# Patient Record
Sex: Male | Born: 1980 | Race: Black or African American | Hispanic: No | Marital: Single | Smoking: Current every day smoker
Health system: Southern US, Community
[De-identification: ages and names within clinical notes are randomized; demographics above are authoritative.]

---

## 1999-06-13 ENCOUNTER — Encounter: Admission: RE | Admit: 1999-06-13 | Discharge: 1999-06-13 | Payer: Self-pay | Admitting: Obstetrics & Gynecology

## 2002-01-15 ENCOUNTER — Emergency Department (HOSPITAL_COMMUNITY): Admission: EM | Admit: 2002-01-15 | Discharge: 2002-01-15 | Payer: Self-pay | Admitting: *Deleted

## 2004-04-21 ENCOUNTER — Emergency Department (HOSPITAL_COMMUNITY): Admission: EM | Admit: 2004-04-21 | Discharge: 2004-04-21 | Payer: Self-pay | Admitting: Emergency Medicine

## 2007-01-09 ENCOUNTER — Emergency Department (HOSPITAL_COMMUNITY): Admission: EM | Admit: 2007-01-09 | Discharge: 2007-01-09 | Payer: Self-pay | Admitting: Emergency Medicine

## 2017-08-16 ENCOUNTER — Ambulatory Visit
Admission: EM | Admit: 2017-08-16 | Discharge: 2017-08-16 | Disposition: A | Discharge status: Home / Self Care | Payer: 59 | Attending: Family Medicine | Admitting: Family Medicine

## 2017-08-16 ENCOUNTER — Encounter: Payer: Self-pay | Admitting: Gynecology

## 2017-08-16 ENCOUNTER — Ambulatory Visit (INDEPENDENT_AMBULATORY_CARE_PROVIDER_SITE_OTHER): Payer: 59

## 2017-08-16 DIAGNOSIS — W231XXA Caught, crushed, jammed, or pinched between stationary objects, initial encounter: Secondary | ICD-10-CM | POA: Diagnosis not present

## 2017-08-16 DIAGNOSIS — S6710XA Crushing injury of unspecified finger(s), initial encounter: Secondary | ICD-10-CM

## 2017-08-16 DIAGNOSIS — S6702XA Crushing injury of left thumb, initial encounter: Secondary | ICD-10-CM | POA: Diagnosis not present

## 2017-08-16 DIAGNOSIS — S60112A Contusion of left thumb with damage to nail, initial encounter: Secondary | ICD-10-CM

## 2017-08-16 NOTE — ED Triage Notes (Signed)
Per patient smashed his left thumb in his car door x yesterday. Pt. C/o left thumb pain

## 2017-08-16 NOTE — ED Provider Notes (Signed)
[facility]    CSN: [id number] Arrival date & time: 08/16/17  1525     History   Chief Complaint Chief Complaint  Patient presents with  . Finger Injury    HPI Ryan Burns is a 36 y.o. male.   HPI  This is a 36 year old male who presents with a thumb injury after he closed it in a car door yesterday. His hand was actually trapped and had open the door to free the thumb from the car jam. That time he's had terrible throbbing pain with a subungual hematoma present at the base of his left nail.      History reviewed. No pertinent past medical history.  There are no active problems to display for this patient.   History reviewed. No pertinent surgical history.     Home Medications    Prior to Admission medications   Not on File    Family History Family History  Problem Relation Age of Onset  . Hypertension Mother   . Lupus Mother   . Cancer Father   . Glaucoma Father     Social History Social History  Substance Use Topics  . Smoking status: Current Every Day Smoker    Packs/day: 1.00  . Smokeless tobacco: Never Used  . Alcohol use No     Allergies   Patient has no known allergies.   Review of Systems Review of Systems  Constitutional: Positive for activity change. Negative for appetite change, chills, fatigue and fever.  Skin: Positive for color change.  All other systems reviewed and are negative.    Physical Exam Triage Vital Signs ED Triage Vitals  Enc Vitals Group     BP 08/16/17 1550 114/70     Pulse Rate 08/16/17 1550 72     Resp 08/16/17 1550 16     Temp 08/16/17 1550 98.3 F (36.8 C)     Temp Source 08/16/17 1550 Oral     SpO2 08/16/17 1550 98 %     Weight 08/16/17 1547 155 lb (70.3 kg)     Height 08/16/17 1547 6' (1.829 m)     Head Circumference --      Peak Flow --      Pain Score 08/16/17 1548 9     Pain Loc --      Pain Edu? --      Excl. in GC? --    No data found.   Updated Vital Signs BP 114/70  (BP Location: Left Arm)   Pulse 72   Temp 98.3 F (36.8 C) (Oral)   Resp 16   Ht 6' (1.829 m)   Wt 155 lb (70.3 kg)   SpO2 98%   BMI 21.02 kg/m   Visual Acuity Right Eye Distance:   Left Eye Distance:   Bilateral Distance:    Right Eye Near:   Left Eye Near:    Bilateral Near:     Physical Exam  Constitutional: He is oriented to person, place, and time. He appears well-developed and well-nourished. No distress.  HENT:  Head: Normocephalic.  Eyes: Pupils are equal, round, and reactive to light.  Neck: Normal range of motion.  Musculoskeletal: Normal range of motion. He exhibits tenderness.  Neurological: He is alert and oriented to person, place, and time.  Skin: Skin is warm and dry. He is not diaphoretic.  Psychiatric: He has a normal mood and affect. His behavior is normal. Judgment and thought content normal.  Nursing note and vitals reviewed.  UC Treatments / Results  Labs (all labs ordered are listed, but only abnormal results are displayed) Labs Reviewed - No data to display  EKG  EKG Interpretation None       Radiology Dg Finger Thumb Left  Result Date: 08/16/2017 CLINICAL DATA:  Trauma to the left thumb with pain. EXAM: LEFT THUMB 2+V COMPARISON:  None. FINDINGS: There is a radiolucency at the base of the first proximal phalanx. If the patient has focal pain here, nondisplaced fracture is not excluded. There is no dislocation. IMPRESSION: There is a radiolucency at the base of the first proximal phalanx. If the patient has focal pain here, nondisplaced fracture is not excluded. Electronically Signed   By: Sherian ReinWei-Chen  Lin M.D.   On: 08/16/2017 16:15    Procedures Procedures (including critical care time) After explaining trephination of the subungual hematoma to the patient the nail was cleansed widely with chlorhexidine prep. Hot cauterizing lance was then utilized to create a small hole in the proximal nail. Immediate discharge of the liquefied hematoma  spewed from the created hole. We gently expressed until all hematoma was evacuated. She tolerated the procedure well. A dry sterile dressing was then applied and a stack splint applied to the distal thumb for protection     Medications Ordered in UC Medications - No data to display   Initial Impression / Assessment and Plan / UC Course  I have reviewed the triage vital signs and the nursing notes.  Pertinent labs & imaging results that were available during my care of the patient were reviewed by me and considered in my medical decision making (see chart for details).     Elevate the hand to control pain and swelling. Signs and symptoms of infection were outlined to the patient. Will return to our clinic if any of these occur. I told him that he may lose his nail but  should grow back within 3-6 months. No antibiotics will be given at this time. He may follow-up with his primary care physician.  Final Clinical Impressions(s) / UC Diagnoses   Final diagnoses:  Crushing injury of finger, initial encounter  Subungual hematoma of left thumb, initial encounter    New Prescriptions There are no discharge medications for this patient.    Controlled Substance Prescriptions [place] Controlled Substance Registry consulted? Not Applicable   Lutricia FeilRoemer, William P, PA-C 08/16/17 1635

## 2019-08-21 DIAGNOSIS — R430 Anosmia: Secondary | ICD-10-CM | POA: Diagnosis not present

## 2019-08-21 DIAGNOSIS — Z1159 Encounter for screening for other viral diseases: Secondary | ICD-10-CM | POA: Diagnosis not present

## 2019-12-03 DIAGNOSIS — H16133 Photokeratitis, bilateral: Secondary | ICD-10-CM | POA: Diagnosis not present

## 2020-07-18 DIAGNOSIS — Z03818 Encounter for observation for suspected exposure to other biological agents ruled out: Secondary | ICD-10-CM | POA: Diagnosis not present

## 2020-07-18 DIAGNOSIS — Z20822 Contact with and (suspected) exposure to covid-19: Secondary | ICD-10-CM | POA: Diagnosis not present

## 2020-10-10 DIAGNOSIS — Z125 Encounter for screening for malignant neoplasm of prostate: Secondary | ICD-10-CM | POA: Diagnosis not present

## 2020-10-10 DIAGNOSIS — Z131 Encounter for screening for diabetes mellitus: Secondary | ICD-10-CM | POA: Diagnosis not present

## 2020-10-10 DIAGNOSIS — Z0000 Encounter for general adult medical examination without abnormal findings: Secondary | ICD-10-CM | POA: Diagnosis not present

## 2020-10-10 DIAGNOSIS — Z13220 Encounter for screening for lipoid disorders: Secondary | ICD-10-CM | POA: Diagnosis not present

## 2020-10-13 DIAGNOSIS — Z0000 Encounter for general adult medical examination without abnormal findings: Secondary | ICD-10-CM | POA: Diagnosis not present

## 2020-10-13 DIAGNOSIS — Z125 Encounter for screening for malignant neoplasm of prostate: Secondary | ICD-10-CM | POA: Diagnosis not present

## 2020-10-13 DIAGNOSIS — Z131 Encounter for screening for diabetes mellitus: Secondary | ICD-10-CM | POA: Diagnosis not present

## 2020-10-13 DIAGNOSIS — Z13220 Encounter for screening for lipoid disorders: Secondary | ICD-10-CM | POA: Diagnosis not present

## 2020-10-13 DIAGNOSIS — E559 Vitamin D deficiency, unspecified: Secondary | ICD-10-CM | POA: Diagnosis not present

## 2020-11-30 ENCOUNTER — Emergency Department: Admission: EM | Admit: 2020-11-30 | Discharge: 2020-11-30 | Discharge status: Against Medical Advice | Payer: 59

## 2020-12-01 ENCOUNTER — Telehealth (HOSPITAL_COMMUNITY): Payer: Self-pay | Admitting: Emergency Medicine

## 2020-12-01 ENCOUNTER — Telehealth (HOSPITAL_COMMUNITY): Payer: Self-pay

## 2020-12-01 ENCOUNTER — Ambulatory Visit (HOSPITAL_COMMUNITY)
Admission: EM | Admit: 2020-12-01 | Discharge: 2020-12-01 | Disposition: A | Discharge status: Home / Self Care | Payer: BC Managed Care – PPO | Attending: Emergency Medicine | Admitting: Emergency Medicine

## 2020-12-01 ENCOUNTER — Other Ambulatory Visit: Payer: Self-pay

## 2020-12-01 ENCOUNTER — Encounter (HOSPITAL_COMMUNITY): Payer: Self-pay | Admitting: Emergency Medicine

## 2020-12-01 ENCOUNTER — Ambulatory Visit (HOSPITAL_COMMUNITY)
Admit: 2020-12-01 | Discharge: 2020-12-01 | Disposition: A | Discharge status: Home / Self Care | Payer: BC Managed Care – PPO | Attending: Emergency Medicine | Admitting: Emergency Medicine

## 2020-12-01 DIAGNOSIS — N50811 Right testicular pain: Secondary | ICD-10-CM | POA: Diagnosis not present

## 2020-12-01 DIAGNOSIS — R369 Urethral discharge, unspecified: Secondary | ICD-10-CM

## 2020-12-01 DIAGNOSIS — N433 Hydrocele, unspecified: Secondary | ICD-10-CM

## 2020-12-01 DIAGNOSIS — N451 Epididymitis: Secondary | ICD-10-CM | POA: Diagnosis not present

## 2020-12-01 DIAGNOSIS — N5089 Other specified disorders of the male genital organs: Secondary | ICD-10-CM | POA: Diagnosis not present

## 2020-12-01 LAB — POCT URINALYSIS DIPSTICK, ED / UC
Bilirubin Urine: NEGATIVE
Glucose, UA: NEGATIVE mg/dL
Hgb urine dipstick: NEGATIVE
Ketones, ur: NEGATIVE mg/dL
Leukocytes,Ua: NEGATIVE
Nitrite: NEGATIVE
Protein, ur: NEGATIVE mg/dL
Specific Gravity, Urine: 1.025 (ref 1.005–1.030)
Urobilinogen, UA: 0.2 mg/dL (ref 0.0–1.0)
pH: 5.5 (ref 5.0–8.0)

## 2020-12-01 MED ORDER — HYDROCODONE-ACETAMINOPHEN 5-325 MG PO TABS
2.0000 | ORAL_TABLET | ORAL | 0 refills | Status: AC | PRN
Start: 2020-12-01 — End: ?

## 2020-12-01 MED ORDER — DOXYCYCLINE HYCLATE 100 MG PO CAPS: 100.0000 mg | ORAL_CAPSULE | Freq: Two times a day (BID) | ORAL | 0 refills | Status: DC

## 2020-12-01 NOTE — Telephone Encounter (Signed)
Pt was called and notified doxycycline for treatment of epididymitis and Norco for pain, sent to the pharmacy. Pt verbalized understanding.

## 2020-12-01 NOTE — ED Notes (Signed)
[place] at [place] today 12/01/20, check in at 2:45 pm

## 2020-12-01 NOTE — ED Provider Notes (Signed)
[facility]    CSN: [id number] Arrival date & time: 12/01/20  9983      History   Chief Complaint Chief Complaint  Patient presents with  . Testicle Pain    Right testicle pain and swelling    HPI Logyn Kendrick is a 39 y.o. male.   HPI   39 year old male here for evaluation of right testicle pain and swelling.  Patient reports that he woke up yesterday morning with the symptoms.  Patient reports that he has had some associated right flank pain, but it is more triggered by movement, and some urinary frequency.  Patient denies dysuria, urgency, blood in his urine, trauma to his groin, heavy lifting, redness, penile discharge, or fever.  Patient is currently on light duty due to finger injury.  History reviewed. No pertinent past medical history.  There are no problems to display for this patient.   History reviewed. No pertinent surgical history.     Home Medications    Prior to Admission medications   Not on File    Family History Family History  Problem Relation Age of Onset  . Hypertension Mother   . Lupus Mother   . Cancer Father   . Glaucoma Father     Social History Social History   Tobacco Use  . Smoking status: Current Every Day Smoker    Packs/day: 1.00  . Smokeless tobacco: Never Used  Substance Use Topics  . Alcohol use: No  . Drug use: No     Allergies   Patient has no known allergies.   Review of Systems Review of Systems  Constitutional: Negative for fever.  Gastrointestinal: Positive for abdominal pain. Negative for nausea and vomiting.       Right lower quadrant pain  Genitourinary: Positive for frequency, scrotal swelling and testicular pain. Negative for dysuria, genital sores, hematuria, penile discharge, penile pain, penile swelling and urgency.  Skin: Negative for color change and rash.  Hematological: Negative.   Psychiatric/Behavioral: Negative.      Physical Exam Triage Vital Signs ED Triage Vitals   Enc Vitals Group     BP 12/01/20 1041 117/79     Pulse Rate 12/01/20 1041 82     Resp 12/01/20 1041 16     Temp 12/01/20 1041 98.3 F (36.8 C)     Temp Source 12/01/20 1041 Oral     SpO2 12/01/20 1041 98 %     Weight --      Height --      Head Circumference --      Peak Flow --      Pain Score 12/01/20 1039 6     Pain Loc --      Pain Edu? --      Excl. in GC? --    No data found.  Updated Vital Signs BP 117/79   Pulse 82   Temp 98.3 F (36.8 C) (Oral)   Resp 16   SpO2 98%   Visual Acuity Right Eye Distance:   Left Eye Distance:   Bilateral Distance:    Right Eye Near:   Left Eye Near:    Bilateral Near:     Physical Exam Vitals and nursing note reviewed.  Constitutional:      General: He is not in acute distress.    Appearance: Normal appearance. He is not ill-appearing.  HENT:     Head: Normocephalic and atraumatic.  Cardiovascular:     Rate and Rhythm: Normal rate and regular rhythm.  Pulses: Normal pulses.     Heart sounds: Normal heart sounds. No murmur heard. No gallop.   Pulmonary:     Effort: Pulmonary effort is normal.     Breath sounds: Normal breath sounds. No wheezing, rhonchi or rales.  Abdominal:     General: Abdomen is flat.     Palpations: Abdomen is soft.     Tenderness: There is no abdominal tenderness. There is no right CVA tenderness or left CVA tenderness.  Genitourinary:    Comments: There is a yellow discharge from the urethral meatus.  Patient's left testicle is normal in size without tenderness to palpation.  The surface feels smooth and not rough.  There is no tenderness to the epididymis or the spermatic cord.  Right testicle is enlarged, tender to palpation, smooth in texture, there is inflammation of the epididymis, with tenderness to palpation.  No tenderness when palpating along the spermatic cord. Skin:    General: Skin is warm and dry.     Capillary Refill: Capillary refill takes less than 2 seconds.     Findings:  No erythema or rash.  Neurological:     General: No focal deficit present.     Mental Status: He is alert and oriented to person, place, and time.  Psychiatric:        Mood and Affect: Mood normal.        Behavior: Behavior normal.        Thought Content: Thought content normal.        Judgment: Judgment normal.      UC Treatments / Results  Labs (all labs ordered are listed, but only abnormal results are displayed) Labs Reviewed  POCT URINALYSIS DIPSTICK, ED / UC  CYTOLOGY, (ORAL, ANAL, URETHRAL) ANCILLARY ONLY    EKG   Radiology No results found.  Procedures Procedures (including critical care time)  Medications Ordered in UC Medications - No data to display  Initial Impression / Assessment and Plan / UC Course  I have reviewed the triage vital signs and the nursing notes.  Pertinent labs & imaging results that were available during my care of the patient were reviewed by me and considered in my medical decision making (see chart for details).   Patient is here for evaluation of right testicle pain and swelling that started yesterday morning when he woke up.  Patient denies any trauma or heavy lifting.  He is actively on light duty at work due to finger injury.  Patient does have a yellow discharge from his penis that he did not notice before physical exam.  Patient is sexually active with his fiance and his girlfriend.  He is having unprotected sex with both.  He denies any symptoms for them.  Will check UA, gonorrhea and chlamydia.  We will also obtain a ultrasound of his right testicle.  UA is negative for nitrites or leukocyte esterase.  Gonorrhea chlamydia is pending.  Results of scrotal ultrasound show right epididymitis, bilateral hydroceles, and 2 right scrotal pearls.  Will treat epididymitis with doxycycline twice daily x7 days, give Norco for pain, and referred to urology for follow-up.   Final Clinical Impressions(s) / UC Diagnoses   Final diagnoses:   Pain in right testicle  Penile discharge     Discharge Instructions     Your ultrasound is scheduled for this afternoon.  You need to check in at 230 at [facility].  Your urine did not show any signs of infection.  We will send urine  for culture analysis and if any infection is present we will treat you at that time.    ED Prescriptions    None     PDMP not reviewed this encounter.   Becky Augusta, NP 12/01/20 1913

## 2020-12-01 NOTE — Discharge Instructions (Addendum)
Your ultrasound is scheduled for this afternoon.  You need to check in at 230 at [facility].  Your urine did not show any signs of infection.  We will send urine for culture analysis and if any infection is present we will treat you at that time.

## 2020-12-01 NOTE — ED Triage Notes (Signed)
Woke up with right testicle pain and swelling. Slight pain in RLQ and right lower back as well. No dysuria. No penile drainage.

## 2020-12-02 NOTE — Telephone Encounter (Signed)
Your ultrasound reveals that you have epididymitis and also a hydrocele.  Hydrocele is a collection of fluid in the scrotum as we discussed earlier.  I am going to treat your epididymitis with doxycycline 100 mg twice daily for 10 days.  Continue to wear scrotal support as we discussed.  I will also prescribe Norco for pain control.  You can also use over-the-counter ibuprofen 6 mg every 6 hours with food.  You will need to follow-up with urology.

## 2020-12-04 LAB — CYTOLOGY, (ORAL, ANAL, URETHRAL) ANCILLARY ONLY
Chlamydia: POSITIVE — AB
Comment: NEGATIVE
Comment: NORMAL
Neisseria Gonorrhea: NEGATIVE

## 2020-12-12 ENCOUNTER — Ambulatory Visit (INDEPENDENT_AMBULATORY_CARE_PROVIDER_SITE_OTHER): Payer: BC Managed Care – PPO | Admitting: Urology

## 2020-12-12 ENCOUNTER — Other Ambulatory Visit: Payer: Self-pay

## 2020-12-12 ENCOUNTER — Encounter: Payer: Self-pay | Admitting: Urology

## 2020-12-12 ENCOUNTER — Other Ambulatory Visit
Admission: RE | Admit: 2020-12-12 | Discharge: 2020-12-12 | Disposition: A | Discharge status: Home / Self Care | Payer: BC Managed Care – PPO | Source: Ambulatory Visit | Attending: Urology | Admitting: Urology

## 2020-12-12 VITALS — BP 121/62 | HR 68 | Ht 73.0 in | Wt 188.4 lb

## 2020-12-12 DIAGNOSIS — R369 Urethral discharge, unspecified: Secondary | ICD-10-CM | POA: Insufficient documentation

## 2020-12-12 DIAGNOSIS — N453 Epididymo-orchitis: Secondary | ICD-10-CM | POA: Diagnosis not present

## 2020-12-12 DIAGNOSIS — N432 Other hydrocele: Secondary | ICD-10-CM

## 2020-12-12 LAB — URINALYSIS, COMPLETE (UACMP) WITH MICROSCOPIC
Bilirubin Urine: NEGATIVE
Glucose, UA: NEGATIVE mg/dL
Hgb urine dipstick: NEGATIVE
Ketones, ur: NEGATIVE mg/dL
Leukocytes,Ua: NEGATIVE
Nitrite: NEGATIVE
Protein, ur: NEGATIVE mg/dL
RBC / HPF: NONE SEEN RBC/hpf (ref 0–5)
Specific Gravity, Urine: >1.03 — ABNORMAL HIGH (ref 1.005–1.030)
pH: 5.5 (ref 5.0–8.0)

## 2020-12-12 NOTE — Progress Notes (Signed)
   12/12/20 9:42 AM   [name] 1981/03/14 [id number]  CC: Chlamydia, epididymitis, hydrocele  HPI: I saw [name] from in urology clinic today for recent diagnosis of right epididymitis and a right hydrocele.  He is a healthy 39 year old male who was having right scrotal swelling and pain and presented to the ED on 12/01/2020.  Work-up at that time was notable for positive chlamydia, and scrotal ultrasound consistent with right-sided epididymitis and likely reactive right hydrocele.  There were no testicular masses.  He reports no prior episodes of epididymitis.  He had trichomoniasis and 2016, but otherwise denies STDs.  He is sexually active with 2 different partners.  He reports his scrotal pain has completely resolved after a 7-day course of doxycycline.  He has some persistent mild scrotal swelling.  He denies any scrotal swelling prior to his recent diagnosis of epididymitis.  He denies any urinary symptoms.  Family History: Family History  Problem Relation Age of Onset  . Hypertension Mother   . Lupus Mother   . Cancer Father   . Glaucoma Father     Social History:  reports that he has been smoking. He has been smoking about 1.00 pack per day. He has never used smokeless tobacco. He reports that he does not drink alcohol and does not use drugs.  Physical Exam: BP 121/62 (BP Location: Left Arm, Patient Position: Sitting, Cuff Size: Large)   Pulse 68   Ht 6\' 1"  (1.854 m)   Wt 188 lb 6.4 oz (85.5 kg)   BMI 24.86 kg/m    Constitutional:  Alert and oriented, No acute distress. Cardiovascular: No clubbing, cyanosis, or edema. Respiratory: Normal respiratory effort, no increased work of breathing. GI: Abdomen is soft, nontender, nondistended, no abdominal masses GU: Phallus with patent meatus, no lesions, testicles 20 cc and descended bilaterally, mild to moderate persistent right scrotal swelling and hydrocele  Laboratory Data: Reviewed, see HPI  Pertinent  Imaging: I have personally viewed and interpreted the scrotal ultrasound dated 12/01/2020 showing right-sided epididymitis and moderate size right hydrocele.  Assessment & Plan:   In summary is a 39 year old male with recent diagnosis of chlamydia epididymitis, and scrotal ultrasound showing no testicular masses, but likely moderate size reactive right hydrocele.  We discussed this likely was in response to his epididymitis, and will resolve with time.  We discussed the natural progression of epididymitis.  We also discussed safe sex practices at length for prevention of recurrent STDs.  He would like to follow-up as needed which is very reasonable.  Would only consider intervention for his hydrocele if it continues to persist after resolution of his epididymitis, and he has bulky and bothersome pain or swelling on the right side.  Follow-up as needed  41, MD 12/12/2020  [facility] [address] [place], [place] [phone]

## 2020-12-12 NOTE — Patient Instructions (Signed)
Epididymitis  Epididymitis is swelling (inflammation) or infection of the epididymis. The epididymis is a cord-like structure that is located along the top and back part of the testicle. It collects and stores sperm from the testicle. This condition can also cause pain and swelling of the testicle and scrotum. Symptoms usually start suddenly (acute epididymitis). Sometimes epididymitis starts gradually and lasts for a while (chronic epididymitis). This type may be harder to treat. What are the causes? In men ages 20-40, this condition is usually caused by a bacterial infection or a sexually transmitted disease (STD), such as:  Gonorrhea.  Chlamydia. In men 40 and older who do not have anal sex, this condition is usually caused by bacteria from a blockage or from abnormalities in the urinary system. These can result from:  Having a tube placed into the bladder (urinary catheter).  Having an enlarged or inflamed prostate gland.  Having recently had urinary tract surgery.  Having a problem with a backward flow of urine (retrograde). In men who have a condition that weakens the body's defense system (immune system), such as HIV, this condition can be caused by:  Other bacteria, including tuberculosis and syphilis.  Viruses.  Fungi. Sometimes this condition occurs without infection. This may happen because of trauma or repetitive activities such as sports. What increases the risk? You are more likely to develop this condition if you have:  Unprotected sex with more than one partner.  Anal sex.  Recently had surgery.  A urinary catheter.  Urinary problems.  A suppressed immune system. What are the signs or symptoms? This condition usually begins suddenly with chills, fever, and pain behind the scrotum and in the testicle. Other symptoms include:  Swelling of the scrotum, testicle, or both.  Pain when ejaculating or urinating.  Pain in the back or  abdomen.  Nausea.  Itching and discharge from the penis.  A frequent need to pass urine.  Redness, increased warmth, and tenderness of the scrotum. How is this diagnosed? Your health care provider can diagnose this condition based on your symptoms and medical history. Your health care provider will also do a physical exam to ask about your symptoms and check your scrotum and testicle for swelling, pain, and redness. You may also have other tests, including:  Examination of discharge from the penis.  Urine tests for infections, such as STDs.  Ultrasound test for blood flow and inflammation. Your health care provider may test you for other STDs, including HIV. How is this treated? Treatment for this condition depends on the cause. If your condition is caused by a bacterial infection, oral antibiotic medicine may be prescribed. If the bacterial infection has spread to your blood, you may need to receive IV antibiotics. For both bacterial and nonbacterial epididymitis, you may be treated with:  Rest.  Elevation of the scrotum.  Pain medicines.  Anti-inflammatory medicines. Surgery may be needed to treat:  Bacterial epididymitis that causes pus to build up in the scrotum (abscess).  Chronic epididymitis that has not responded to other treatments. Follow these instructions at home: Medicines  Take over-the-counter and prescription medicines only as told by your health care provider.  If you were prescribed an antibiotic medicine, take it as told by your health care provider. Do not stop taking the antibiotic even if your condition improves. Sexual activity  If your epididymitis was caused by an STD, avoid sexual activity until your treatment is complete.  Inform your sexual partner or partners if you test positive for   an STD. They may need to be treated. Do not engage in sexual activity with your partner or partners until their treatment is completed. Managing pain and  swelling   If directed, elevate your scrotum and apply ice. ? Put ice in a plastic bag. ? Place a small towel or pillow between your legs. ? Rest your scrotum on the pillow or towel. ? Place another towel between your skin and the plastic bag. ? Leave the ice on for 20 minutes, 2-3 times a day.  Try taking a sitz bath to help with discomfort. This is a warm water bath that is taken while you are sitting down. The water should only come up to your hips and should cover your buttocks. Do this 3-4 times per day or as told by your health care provider.  Keep your scrotum elevated and supported while resting. Ask your health care provider if you should wear a scrotal support, such as a jockstrap. Wear it as told by your health care provider. General instructions  Return to your normal activities as told by your health care provider. Ask your health care provider what activities are safe for you.  Drink enough fluid to keep your urine pale yellow.  Keep all follow-up visits as told by your health care provider. This is important. Contact a health care provider if:  You have a fever.  Your pain medicine is not helping.  Your pain is getting worse.  Your symptoms do not improve within 3 days. Summary  Epididymitis is swelling (inflammation) or infection of the epididymis. This condition can also cause pain and swelling of the testicle and scrotum.  Treatment for this condition depends on the cause. If your condition is caused by a bacterial infection, oral antibiotic medicine may be prescribed.  Inform your sexual partner or partners if you test positive for an STD. They may need to be treated. Do not engage in sexual activity with your partner or partners until their treatment is completed.  Contact a health care provider if your symptoms do not improve within 3 days. This information is not intended to replace advice given to you by your health care provider. Make sure you discuss any  questions you have with your health care provider. Document Revised: 10/05/2018 Document Reviewed: 10/06/2018 Elsevier Patient Education  2020 [employer]   Chlamydia, Male  Chlamydia is an STD (sexually transmitted disease). It is a bacterial infection that spreads through sexual contact (is contagious). Chlamydia can occur in different areas of the body, including the tube that moves urine from the bladder out of the body (urethra), the throat, or the rectum. This condition is not difficult to treat. However, if left untreated, chlamydia can lead to more serious health problems. What are the causes? Chlamydia is caused by the bacteria Chlamydia trachomatis. It is passed from an infected partner during sexual activity. Chlamydia can spread through contact with the genitals, mouth, or rectum. What are the signs or symptoms? In some cases, there may not be any symptoms for this condition (asymptomatic), especially early in the infection. If symptoms develop, they may include:  Burning when urinating.  Urinating frequently.  Pain or swelling in the testicles.  Watery, mucus-like discharge from the penis.  Redness, soreness, and swelling (inflammation) of the rectum.  Bleeding or discharge from the rectum.  Abdominal pain.  Itching, burning, or redness in the eyes, or discharge from the eyes. How is this diagnosed? This condition may be diagnosed based on:  Urine  tests.  Swab tests. Depending on your symptoms, your health care provider may use a cotton swab to collect discharge from your urethra or rectum to test for the bacteria. How is this treated? This condition is treated with antibiotic medicines. Follow these instructions at home: Medicines  Take over-the-counter and prescription medicines only as told by your health care provider.  Take your antibiotic medicine as told by your health care provider. Do not stop taking the antibiotic even if you start to feel  better. Sexual activity  Tell sexual partners about your infection. This includes any oral, anal, or vaginal sex partners you have had within 60 days of when your symptoms started. Sexual partners should also be treated, even if they have no signs of the disease.  Do not have sex until you and your sexual partners have completed treatment and your health care provider says it is okay. If your health care provider prescribed you a single dose treatment, wait 7 days after taking the treatment before having sex. General instructions  It is your responsibility to get your test results. Ask your health care provider, or the department performing the test, when your results will be ready.  Get plenty of rest.  Eat a healthy, well-balanced diet.  Drink enough fluids to keep your urine clear or pale yellow.  Keep all follow-up visits as told by your health care provider. This is important. You may need to be tested for infection again 3 months after treatment. How is this prevented? The only sure way to prevent chlamydia is to avoid sexual intercourse. However, you can lower your risk by:  Using latex condoms correctly every time you have sexual intercourse.  Not having multiple sexual partners.  Asking if your sexual partner has been tested for STIs and had negative results. Contact a health care provider if:  You develop new symptoms or your symptoms do not get better after completing treatment.  You have a fever or chills.  You have pain during sexual intercourse.  You develop new joint pain or swelling near your joints.  You have pain or soreness in your testicles. Get help right away if:  Your pain gets worse and does not get better with medicine.  You have abnormal discharge.  You develop flu-like symptoms, such as night sweats, sore throat, or muscle aches. Summary  Chlamydia is an STD (sexually transmitted disease). It is a bacterial infection that spreads (is contagious)  through sexual contact.  This condition is not difficult to treat, however, if left untreated, it can lead to more serious health problems.  In some cases, there may not be any symptoms for this condition (asymptomatic).  This condition is treated with antibiotic medicines.  Using latex condoms correctly every time you have sexual intercourse can help prevent chlamydia. This information is not intended to replace advice given to you by your health care provider. Make sure you discuss any questions you have with your health care provider. Document Revised: 12/17/2017 Document Reviewed: 11/18/2016 Elsevier Patient Education  2020 Else[employer] Inc.   Hydrocele, Adult A hydrocele is a collection of fluid in the loose pouch of skin that holds the testicles (scrotum). This may happen because:  The amount of fluid produced in the scrotum is not absorbed by the rest of the body.  Fluid from the abdomen fills the scrotum. Normally, the testicles develop in the abdomen then move (drop) into to the scrotum before birth. The tube that the testicles travel through usually closes after  the testicles drop. If the tube does not close, fluid from the abdomen can fill the scrotum. This is less common in adults. What are the causes? The cause of a hydrocele in adults is usually not known. However, it may be caused by:  An injury to the scrotum.  An infection (epididymitis).  Decreased blood flow to the scrotum.  Twisting of a testicle (testicular torsion).  A birth defect.  A tumor or cancer of the testicle. What are the signs or symptoms? A hydrocele feels like a water-filled balloon. It may also feel heavy. Other symptoms include:  Swelling of the scrotum. The swelling may decrease when you lie down. You may also notice more swelling at night than in the morning.  Swelling of the groin.  Mild discomfort in the scrotum.  Pain. This can develop if the hydrocele was caused by infection or  twisting. The larger the hydrocele, the more likely you are to have pain. How is this diagnosed? This condition may be diagnosed based on:  Physical exam.  Medical history. You may also have other tests, including:  Imaging tests, such as ultrasound.  Blood or urine tests. How is this treated? Most hydroceles go away on their own. If you have no discomfort or pain, your health care provider may suggest close monitoring of your condition (called watch and wait or watchful waiting) until the condition goes away or symptoms develop. If treatment is needed, it may include:  Treating an underlying condition. This may include using an antibiotic medicine to treat an infection.  Surgery to stop fluid from collecting in the scrotum.  Surgery to drain the fluid. Options include: ? Needle aspiration. A needle is used to drain fluid. However, the fluid buildup will come back quickly. ? Hydrocelectomy. For this procedure, an incision is made in the scrotum to remove the fluid sac. Follow these instructions at home:  Watch the hydrocele for any changes.  Take over-the-counter and prescription medicines only as told by your health care provider.  If you were prescribed an antibiotic medicine, use it as told by your health care provider. Do not stop taking the antibiotic even if you start to feel better.  Keep all follow-up visits as told by your health care provider. This is important. Contact a health care provider if:  You notice any changes in the hydrocele.  The swelling in your scrotum or groin gets worse.  The hydrocele becomes red, firm, painful, or tender to the touch.  You have a fever. Get help right away if you:  Develop a lot of pain, or your pain becomes worse. Summary  A hydrocele is a collection of fluid in the loose pouch of skin that holds the testicles (scrotum).  Hydroceles can cause swelling, discomfort, and sometimes pain.  In adults, the cause of a hydrocele  usually is not known. However, it is sometimes caused by an infection or a rotation and twisting of the scrotum.  Treatment is usually not needed. Hydroceles often go away on their own. If a hydrocele causes pain, treatment may be given to ease the pain. This information is not intended to replace advice given to you by your health care provider. Make sure you discuss any questions you have with your health care provider. Document Revised: 12/13/2017 Document Reviewed: 12/13/2017 Elsevier Patient Education  2020 [employer].

## 2020-12-14 LAB — URINE CULTURE: Culture: NO GROWTH

## 2021-01-12 DIAGNOSIS — M79642 Pain in left hand: Secondary | ICD-10-CM | POA: Diagnosis not present

## 2021-05-23 DIAGNOSIS — N342 Other urethritis: Secondary | ICD-10-CM | POA: Diagnosis not present

## 2021-06-06 DIAGNOSIS — Z716 Tobacco abuse counseling: Secondary | ICD-10-CM | POA: Diagnosis not present

## 2021-06-06 DIAGNOSIS — F17210 Nicotine dependence, cigarettes, uncomplicated: Secondary | ICD-10-CM | POA: Diagnosis not present

## 2021-10-06 ENCOUNTER — Emergency Department: Payer: Self-pay

## 2021-10-06 ENCOUNTER — Other Ambulatory Visit: Payer: Self-pay

## 2021-10-06 ENCOUNTER — Emergency Department
Admission: EM | Admit: 2021-10-06 | Discharge: 2021-10-06 | Disposition: A | Discharge status: Home / Self Care | Payer: Self-pay | Attending: Emergency Medicine | Admitting: Emergency Medicine

## 2021-10-06 DIAGNOSIS — J40 Bronchitis, not specified as acute or chronic: Secondary | ICD-10-CM | POA: Insufficient documentation

## 2021-10-06 DIAGNOSIS — F17210 Nicotine dependence, cigarettes, uncomplicated: Secondary | ICD-10-CM | POA: Insufficient documentation

## 2021-10-06 DIAGNOSIS — Z20822 Contact with and (suspected) exposure to covid-19: Secondary | ICD-10-CM | POA: Insufficient documentation

## 2021-10-06 LAB — RESP PANEL BY RT-PCR (FLU A&B, COVID) ARPGX2
Influenza A by PCR: NEGATIVE
Influenza B by PCR: NEGATIVE
SARS Coronavirus 2 by RT PCR: NEGATIVE

## 2021-10-06 MED ORDER — ALBUTEROL SULFATE HFA 108 (90 BASE) MCG/ACT IN AERS: 2.0000 | INHALATION_SPRAY | Freq: Four times a day (QID) | RESPIRATORY_TRACT | 2 refills | Status: AC | PRN

## 2021-10-06 MED ORDER — PREDNISONE 10 MG (21) PO TBPK: ORAL_TABLET | ORAL | 0 refills | Status: AC

## 2021-10-06 MED ORDER — PREDNISONE 20 MG PO TABS
60.0000 mg | ORAL_TABLET | Freq: Once | ORAL | Status: AC
  Administered 2021-10-06: 60 mg via ORAL
  Filled 2021-10-06: qty 3

## 2021-10-06 NOTE — ED Triage Notes (Signed)
Pt presents to ER c/o cold symptoms for last few days that have mostly resolved but today pt noticed he was having some upper resp congestion and a cough.  Pt states he is coughing up light green/brown phlegm when he does cough.  Pt does not appear in distress in triage.

## 2021-10-06 NOTE — Discharge Instructions (Signed)
You can take 4 puffs of albuterol every 6 hours as needed for wheezing. Take tapered steroid as directed.

## 2021-10-06 NOTE — ED Provider Notes (Signed)
[facility]-EMERGENCY DEPARTMENT  ____________________________________________  Time seen: Approximately 11:20 PM  I have reviewed the triage vital signs and the nursing notes.   HISTORY  Chief Complaint URI   Historian Patient     HPI Ryan Burns is a 40 y.o. male presents to the emergency department with cough for the past 4 to 5 days and low-grade fever.  Patient reports that overall he is feeling better but noticed that he started wheezing last night and became concerned.  No current chest pain, chest tightness or shortness of breath.   History reviewed. No pertinent past medical history.   Immunizations up to date:  Yes.     History reviewed. No pertinent past medical history.  There are no problems to display for this patient.   History reviewed. No pertinent surgical history.  Prior to Admission medications   Medication Sig Start Date End Date Taking? Authorizing Provider  albuterol (VENTOLIN HFA) 108 (90 Base) MCG/ACT inhaler Inhale 2 puffs into the lungs every 6 (six) hours as needed for wheezing or shortness of breath. 10/06/21  Yes Pia Mau M, PA-C  predniSONE (STERAPRED UNI-PAK 21 TAB) 10 MG (21) TBPK tablet Take 6 tablets the first day, take 5 tablets the second day, take 4 tablets the third day, take 3 tablets the fourth day, take 2 tablets the fifth day, take 1 tablet the sixth day. 10/06/21  Yes Pia Mau M, PA-C  doxycycline (VIBRAMYCIN) 100 MG capsule Take 1 capsule (100 mg total) by mouth 2 (two) times daily. 12/01/20   Becky Augusta, NP  HYDROcodone-acetaminophen (NORCO/VICODIN) 5-325 MG tablet Take 2 tablets by mouth every 4 (four) hours as needed. 12/01/20   Becky Augusta, NP  Vitamin D, Ergocalciferol, (DRISDOL) 1.25 MG (50000 UNIT) CAPS capsule Take 50,000 Units by mouth once a week. 12/06/20   [provider]    Allergies Patient has no known allergies.  Family History  Problem Relation Age of Onset   Hypertension Mother     Lupus Mother    Cancer Father    Glaucoma Father     Social History Social History   Tobacco Use   Smoking status: Every Day    Packs/day: 1.00    Types: Cigarettes   Smokeless tobacco: Never  Substance Use Topics   Alcohol use: No   Drug use: No     Review of Systems  Constitutional: No fever/chills Eyes:  No discharge ENT: No upper respiratory complaints. Respiratory: Patient has cough and wheezing.  Gastrointestinal:   No nausea, no vomiting.  No diarrhea.  No constipation. Musculoskeletal: Negative for musculoskeletal pain. Skin: Negative for rash, abrasions, lacerations, ecchymosis.   ____________________________________________   PHYSICAL EXAM:  VITAL SIGNS: ED Triage Vitals  Enc Vitals Group     BP 10/06/21 2134 127/72     Pulse Rate 10/06/21 2134 67     Resp 10/06/21 2134 19     Temp 10/06/21 2134 98.5 F (36.9 C)     Temp Source 10/06/21 2134 Oral     SpO2 10/06/21 2134 97 %     Weight 10/06/21 2135 165 lb (74.8 kg)     Height 10/06/21 2135 6\' 1"  (1.854 m)     Head Circumference --      Peak Flow --      Pain Score 10/06/21 2135 4     Pain Loc --      Pain Edu? --      Excl. in GC? --      Constitutional:  Alert and oriented. Patient is lying supine. Eyes: Conjunctivae are normal. PERRL. EOMI. Head: Atraumatic. ENT:      Ears: Tympanic membranes are mildly injected with mild effusion bilaterally.       Nose: No congestion/rhinnorhea.      Mouth/Throat: Mucous membranes are moist. Posterior pharynx is mildly erythematous.  Hematological/Lymphatic/Immunilogical: No cervical lymphadenopathy.  Cardiovascular: Normal rate, regular rhythm. Normal S1 and S2.  Good peripheral circulation. Respiratory: Normal respiratory effort without tachypnea or retractions. Lungs CTAB. Good air entry to the bases with no decreased or absent breath sounds. Gastrointestinal: Bowel sounds 4 quadrants. Soft and nontender to palpation. No guarding or rigidity. No  palpable masses. No distention. No CVA tenderness. Musculoskeletal: Full range of motion to all extremities. No gross deformities appreciated. Neurologic:  Normal speech and language. No gross focal neurologic deficits are appreciated.  Skin:  Skin is warm, dry and intact. No rash noted. Psychiatric: Mood and affect are normal. Speech and behavior are normal. Patient exhibits appropriate insight and judgement.   ____________________________________________   LABS (all labs ordered are listed, but only abnormal results are displayed)  Labs Reviewed  RESP PANEL BY RT-PCR (FLU A&B, COVID) [id number]   ____________________________________________  EKG   ____________________________________________  RADIOLOGY Geraldo Pitter, personally viewed and evaluated these images (plain radiographs) as part of my medical decision making, as well as reviewing the written report by the radiologist.  DG Chest 2 View  Result Date: 10/06/2021 CLINICAL DATA:  Cough and congestion. EXAM: CHEST - 2 VIEW COMPARISON:  None. FINDINGS: The heart size and mediastinal contours are within normal limits. Both lungs are clear. The visualized skeletal structures are unremarkable. IMPRESSION: No active cardiopulmonary disease. Electronically Signed   By: Aram Candela M.D.   On: 10/06/2021 21:50    ____________________________________________    PROCEDURES  Procedure(s) performed:     Procedures     Medications  predniSONE (DELTASONE) tablet 60 mg (has no administration in time range)     ____________________________________________   INITIAL IMPRESSION / ASSESSMENT AND PLAN / ED COURSE  Pertinent labs & imaging results that were available during my care of the patient were reviewed by me and considered in my medical decision making (see chart for details).      Assessment and plan Bronchitis 40 year old male presents to the emergency department with cough and wheezing for the past 3 to  4 days.  No signs of pneumonia on chest x-ray.  Vital signs are reassuring at triage.  On physical exam, patient was alert, active and nontoxic-appearing.  We will treat with tapered prednisone and albuterol inhaler at discharge.  Return precautions were given to return with new or worsening symptoms.     ____________________________________________  FINAL CLINICAL IMPRESSION(S) / ED DIAGNOSES  Final diagnoses:  Bronchitis      NEW MEDICATIONS STARTED DURING THIS VISIT:  ED Discharge Orders          Ordered    predniSONE (STERAPRED UNI-PAK 21 TAB) 10 MG (21) TBPK tablet        10/06/21 2317    albuterol (VENTOLIN HFA) 108 (90 Base) MCG/ACT inhaler  Every 6 hours PRN        10/06/21 2317                This chart was dictated using voice recognition software/Dragon. Despite best efforts to proofread, errors can occur which can change the meaning. Any change was purely unintentional.     Orvil Feil, PA-C 10/06/21  2322    Sharman Cheek, MD 10/07/21 Perlie Mayo

## 2021-10-06 NOTE — ED Notes (Signed)
Dc ppw provided. Rx infor provided. Pt denies questions at this time. Verbal consent for DC> pt assisted off unit on foot.

## 2021-12-09 ENCOUNTER — Other Ambulatory Visit: Payer: Self-pay

## 2021-12-09 ENCOUNTER — Encounter: Payer: Self-pay | Admitting: Emergency Medicine

## 2021-12-09 DIAGNOSIS — F17210 Nicotine dependence, cigarettes, uncomplicated: Secondary | ICD-10-CM | POA: Insufficient documentation

## 2021-12-09 DIAGNOSIS — K029 Dental caries, unspecified: Secondary | ICD-10-CM | POA: Insufficient documentation

## 2021-12-09 NOTE — ED Triage Notes (Signed)
Pt to ED from home c/o upper back left tooth pain x3 days.  States has cavity there but does not have insurance yet to go to dentist to have it filled.  Denies fevers.

## 2021-12-10 ENCOUNTER — Emergency Department
Admission: EM | Admit: 2021-12-10 | Discharge: 2021-12-10 | Disposition: A | Discharge status: Home / Self Care | Payer: Self-pay | Attending: Emergency Medicine | Admitting: Emergency Medicine

## 2021-12-10 DIAGNOSIS — K029 Dental caries, unspecified: Secondary | ICD-10-CM

## 2021-12-10 MED ORDER — NAPROXEN 500 MG PO TABS: 500.0000 mg | ORAL_TABLET | Freq: Two times a day (BID) | ORAL | 0 refills | Status: AC

## 2021-12-10 MED ORDER — NAPROXEN 500 MG PO TABS
500.0000 mg | ORAL_TABLET | Freq: Once | ORAL | Status: AC
  Administered 2021-12-10: 01:00:00 500 mg via ORAL
  Filled 2021-12-10: qty 1

## 2021-12-10 MED ORDER — AMOXICILLIN 500 MG PO TABS: 500.0000 mg | ORAL_TABLET | Freq: Two times a day (BID) | ORAL | 0 refills | Status: AC

## 2021-12-10 MED ORDER — LIDOCAINE VISCOUS HCL 2 % MT SOLN: 15.0000 mL | OROMUCOSAL | 0 refills | Status: AC | PRN

## 2021-12-10 MED ORDER — LIDOCAINE VISCOUS HCL 2 % MT SOLN
15.0000 mL | Freq: Once | OROMUCOSAL | Status: AC
  Administered 2021-12-10: 01:00:00 15 mL via OROMUCOSAL
  Filled 2021-12-10: qty 15

## 2021-12-10 NOTE — ED Provider Notes (Signed)
[facility] Emergency Department Provider Note  ____________________________________________  Time seen: Approximately 1:54 AM  I have reviewed the triage vital signs and the nursing notes.   HISTORY  Chief Complaint Dental Pain   HPI Ryan Burns is a 40 y.o. male presents for evaluation of dental pain.  Patient reports having a cavity on his left upper molar for a while but started having pain 3 days ago.  Is complaining of throbbing sharp constant pain.  Has taken Tylenol and ibuprofen with no significant relief.  He does have a dentist but because of the Christmas holiday has not been able to get in with an appointment.  He denies trismus, drooling, fever.  History reviewed. No pertinent past medical history.  There are no problems to display for this patient.   History reviewed. No pertinent surgical history.  Prior to Admission medications   Medication Sig Start Date End Date Taking? Authorizing Provider  amoxicillin (AMOXIL) 500 MG tablet Take 1 tablet (500 mg total) by mouth 2 (two) times daily for 7 days. 12/10/21 12/17/21 Yes Jai Steil, [place], MD  lidocaine (XYLOCAINE) 2 % solution Use as directed 15 mLs in the mouth or throat as needed for mouth pain. 12/10/21  Yes Don Perking, [place], MD  naproxen (NAPROSYN) 500 MG tablet Take 1 tablet (500 mg total) by mouth 2 (two) times daily with a meal. 12/10/21 12/10/22 Yes Don Perking, [place], MD  albuterol (VENTOLIN HFA) 108 (90 Base) MCG/ACT inhaler Inhale 2 puffs into the lungs every 6 (six) hours as needed for wheezing or shortness of breath. 10/06/21   Orvil Feil, PA-C  HYDROcodone-acetaminophen (NORCO/VICODIN) 5-325 MG tablet Take 2 tablets by mouth every 4 (four) hours as needed. 12/01/20   Becky Augusta, NP  predniSONE (STERAPRED UNI-PAK 21 TAB) 10 MG (21) TBPK tablet Take 6 tablets the first day, take 5 tablets the second day, take 4 tablets the third day, take 3 tablets the fourth day, take 2  tablets the fifth day, take 1 tablet the sixth day. 10/06/21   Orvil Feil, PA-C  Vitamin D, Ergocalciferol, (DRISDOL) 1.25 MG (50000 UNIT) CAPS capsule Take 50,000 Units by mouth once a week. 12/06/20   [provider]    Allergies Patient has no known allergies.  Family History  Problem Relation Age of Onset   Hypertension Mother    Lupus Mother    Cancer Father    Glaucoma Father     Social History Social History   Tobacco Use   Smoking status: Every Day    Packs/day: 1.00    Types: Cigarettes   Smokeless tobacco: Never  Substance Use Topics   Alcohol use: Yes   Drug use: Yes    Types: Marijuana    Review of Systems  Constitutional: Negative for fever. Eyes: Negative for visual changes. ENT: Negative for sore throat. + dental pain Neck: No neck pain  Cardiovascular: Negative for chest pain. Respiratory: Negative for shortness of breath. Gastrointestinal: Negative for abdominal pain, vomiting or diarrhea. Genitourinary: Negative for dysuria. Musculoskeletal: Negative for back pain. Skin: Negative for rash. Neurological: Negative for headaches, weakness or numbness. Psych: No SI or HI  ____________________________________________   PHYSICAL EXAM:  VITAL SIGNS: ED Triage Vitals  Enc Vitals Group     BP 12/09/21 2322 120/68     Pulse Rate 12/09/21 2322 65     Resp 12/09/21 2322 18     Temp 12/09/21 2322 98 F (36.7 C)     Temp Source  12/09/21 2322 Oral     SpO2 12/09/21 2322 96 %     Weight 12/09/21 2321 155 lb (70.3 kg)     Height 12/09/21 2321 6' (1.829 m)     Head Circumference --      Peak Flow --      Pain Score 12/09/21 2321 7     Pain Loc --      Pain Edu? --      Excl. in GC? --     Constitutional: Alert and oriented. Well appearing and in no apparent distress. HEENT:      Head: Normocephalic and atraumatic.         Eyes: Conjunctivae are normal. Sclera is non-icteric.       Mouth/Throat: Mucous membranes are moist.  Left  upper molar has a deep cavity with no signs of abscess.  Floor of the mouth is soft with no induration or tenderness.  There is no trismus      Neck: Supple with no signs of meningismus. Cardiovascular: Regular rate and rhythm.  Respiratory: Normal respiratory effort.  Musculoskeletal:  No edema, cyanosis, or erythema of extremities. Neurologic: Normal speech and language. Face is symmetric. Moving all extremities. No gross focal neurologic deficits are appreciated. Skin: Skin is warm, dry and intact. No rash noted. Psychiatric: Mood and affect are normal. Speech and behavior are normal.  ____________________________________________   LABS (all labs ordered are listed, but only abnormal results are displayed)  Labs Reviewed - No data to display ____________________________________________  EKG  none  ____________________________________________  RADIOLOGY  none  ____________________________________________   PROCEDURES  Procedure(s) performed: None Procedures   Critical Care performed:  None ____________________________________________   INITIAL IMPRESSION / ASSESSMENT AND PLAN / ED COURSE  40 y.o. male presents for evaluation of dental pain.  Pain from cavity.  Patient was see his dentist this week.  Patient was given a dose of naproxen and viscous lidocaine.  We will give a prescription for both.  We will also give a prescription for amoxicillin in case patient is unable to get in with his dentist for the next week.  Discussed the recommendation with him.  No signs of Ludewig's angina or abscess.  Discussed my standard return precautions and follow-up      _____________________________________________ Please note:  Patient was evaluated in Emergency Department today for the symptoms described in the history of present illness. Patient was evaluated in the context of the global COVID-19 pandemic, which necessitated consideration that the patient might be at risk for  infection with the SARS-CoV-2 virus that causes COVID-19. Institutional protocols and algorithms that pertain to the evaluation of patients at risk for COVID-19 are in a state of rapid change based on information released by regulatory bodies including the CDC and federal and state organizations. These policies and algorithms were followed during the patient's care in the ED.  Some ED evaluations and interventions may be delayed as a result of limited staffing during the pandemic.   [place] Controlled Substance Database was reviewed by me. ____________________________________________   FINAL CLINICAL IMPRESSION(S) / ED DIAGNOSES   Final diagnoses:  Dental caries      NEW MEDICATIONS STARTED DURING THIS VISIT:  ED Discharge Orders          Ordered    naproxen (NAPROSYN) 500 MG tablet  2 times daily with meals        12/10/21 0154    lidocaine (XYLOCAINE) 2 % solution  As needed  12/10/21 0154    amoxicillin (AMOXIL) 500 MG tablet  2 times daily        12/10/21 0154             Note:  This document was prepared using Dragon voice recognition software and may include unintentional dictation errors.    Nita Sickle, MD 12/10/21 [phone]

## 2021-12-10 NOTE — Discharge Instructions (Signed)
OPTIONS FOR DENTAL FOLLOW UP CARE ° °[place] [facility] and [facility] - Local Safety Net Dental Clinics °http://www.ncdhhs.gov/dph/oralhealth/services/safetynetclinics.htm °  °[facility] (336-562-3123) ° °[facility] ([phone]) ° °[facility] ([phone] ext [phone]) ° °[facility] ([phone]) ° °[facility] ([phone]) °This clinic caters to the indigent population and is on a lottery system. °Location: °[facility], [address], [place] °Clinic Hours: °Wednesdays from 6pm - 9pm, patients seen by a lottery system. °For dates, call or go to [email or web address]/[facility]/[email or web address] °Services: °Cleanings, fillings and simple extractions. °Payment Options: °DENTAL WORK IS FREE OF CHARGE. Bring proof of income or support. °Best way to get seen: °Arrive at 5:15 pm - this is a lottery, NOT first come/first serve, so arriving earlier will not increase your chances of being seen. °  °  °[facility] °[phone] °Select option 1 for emergencies °  °Location: °[facility], [address], [place] °Clinic Hours: °No walk-ins accepted - call the day before to schedule an appointment. °Check in times are 9:30 am and 1:30 pm. °Services: °Simple extractions, temporary fillings, pulpectomy/pulp debridement, uncomplicated abscess drainage. °Payment Options: °PAYMENT IS DUE AT THE TIME OF SERVICE.  Fee is usually $100-200, additional surgical procedures (e.g. abscess drainage) may be extra. °Cash, checks, Visa/MasterCard accepted.  Can file Medicaid if patient is covered for dental - patient should call case worker to check. °No discount for [facility] patients. °Best way to get seen: °MUST call the day before and get onto the schedule. Can usually be seen the next 1-2 days. No walk-ins accepted. °  °  °[facility] °[phone] °   °Location: °[facility], [address], [place] °Clinic Hours: °M, W, Th, F 8am or 1:30pm, Tues 9a or 1:30 - first come/first served. °Services: °Simple extractions, temporary fillings, uncomplicated abscess drainage.  You do not need to be an [place] resident. °Payment Options: °PAYMENT IS DUE AT THE TIME OF SERVICE. °Dental insurance, otherwise sliding scale - bring proof of income or support. °Depending on income and treatment needed, cost is usually $50-200. °Best way to get seen: °Arrive early as it is first come/first served. °  °  °[facility] °[phone] °  °Location: °[address] °Clinic Hours: °Mon-Thu 8a-5p °Services: °Most basic dental services including extractions and fillings. °Payment Options: °PAYMENT IS DUE AT THE TIME OF SERVICE. °Sliding scale, up to 50% off - bring proof if income or support. °Medicaid with dental option accepted. °Best way to get seen: °Call to schedule an appointment, can usually be seen within 2 weeks OR they will try to see walk-ins - show up at 8a or 2p (you may have to wait). °  °  °[facility] °[phone] °[place] RESIDENTS ONLY °  °Location: °[facility], [address], [place], [place] [address] °Clinic Hours: By appointment only. °Monday - Thursday 8am-5pm, Friday 8am-12pm °Services: Cleanings, fillings, extractions. °Payment Options: °PAYMENT IS DUE AT THE TIME OF SERVICE. °Cash, Visa or MasterCard. Sliding scale - $30 minimum per service. °Best way to get seen: °Come in to office, complete packet and make an appointment - need proof of income °or support monies for each household member and proof of [place] residence. °Usually takes about a month to get in. °  °  °[facility] °[phone] °  °Location: °[address]   [place] °Clinic Hours: Walk-in Urgent Care Dental Services are offered Monday-Friday  mornings only. °The numbers of emergencies accepted daily is limited to the number of °providers available. °Maximum 15 - Mondays, Wednesdays & Thursdays °Maximum 10 - Tuesdays & Fridays °Services: °You do not need to be a [place] resident to be seen for a dental emergency. °Emergencies are defined as pain, swelling, abnormal bleeding, or dental trauma. Walkins will receive x-rays if needed. °NOTE: Dental cleaning is not an emergency. °Payment Options: °PAYMENT IS DUE AT THE TIME OF SERVICE. °Minimum co-pay is $40.00 for uninsured patients. °Minimum co-pay is $3.00 for Medicaid with dental coverage. °Dental Insurance is accepted and must be presented at time of visit. °Medicare does not cover dental. °Forms of payment: Cash, credit card, checks. °Best way to get seen: °If not previously registered with the clinic, walk-in dental registration begins at 7:15 am and is on a first come/first serve basis. °If previously registered with the clinic, call to make an appointment. °  °  °[facility] °[phone] °[place] RESIDENTS ONLY °  °Location: °[address], [place], [place] °Clinic Hours: °Mon-Thu 10a-2p °Services: Extractions only! °Payment Options: °FREE (donations accepted) - bring proof of income or support °Best way to get seen: °Call and schedule an appointment OR come at 8am on the 1st Monday of every month (except for holidays) when it is first come/first served. °  °  °[facility] °[phone] °  °Location: °[address], [place] °Clinic Hours: °Friday mornings °Services, Payment Options, Best way to get seen: °Call for info °

## 2022-10-30 IMAGING — US US SCROTUM W/ DOPPLER COMPLETE
1 series · 13 of 25 positions shown · non-contrast
Comparison: None

CLINICAL DATA: RIGHT testicular pain and swelling since yesterday
morning

EXAM:
SCROTAL ULTRASOUND
DOPPLER ULTRASOUND OF THE TESTICLES
TECHNIQUE: Complete ultrasound examination of the testicles, epididymis, and
other scrotal structures was performed. Color and spectral Doppler
ultrasound were also utilized to evaluate blood flow to the
testicles.

[Series 1001: testis us · 13 of 83 slices shown]
[im 1/83]
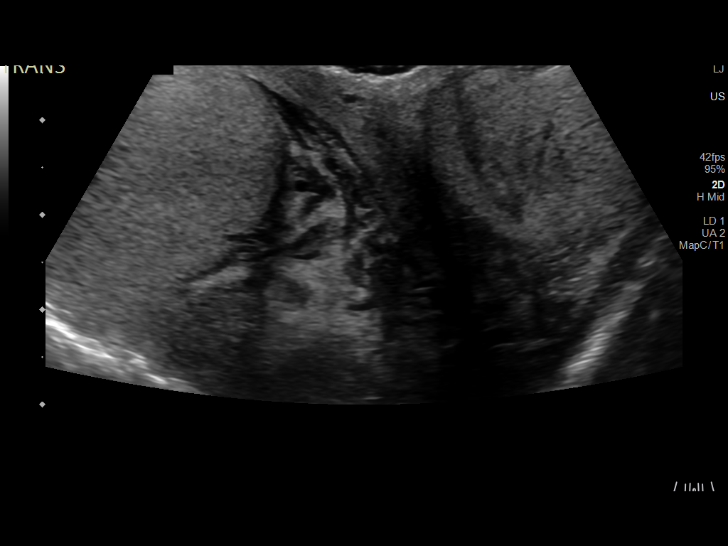
[im 7/83]
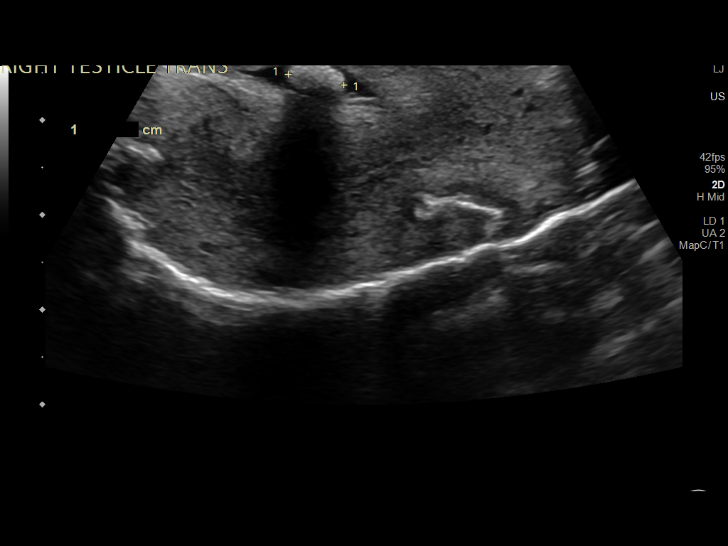
[im 14/83]
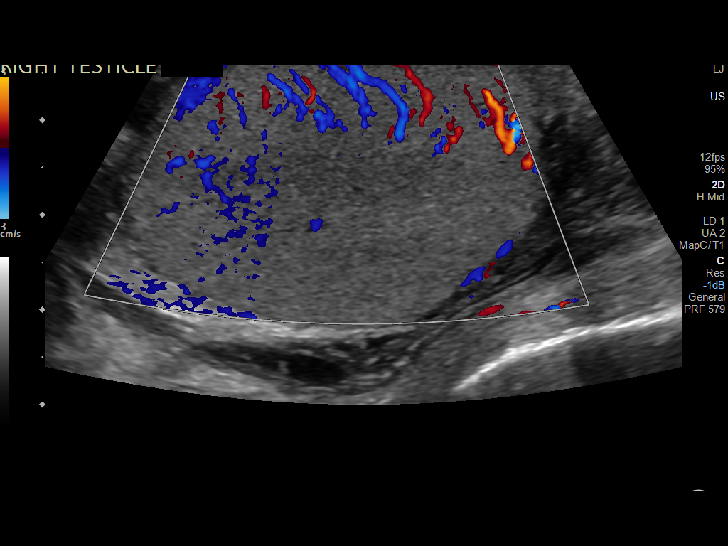
[im 21/83]
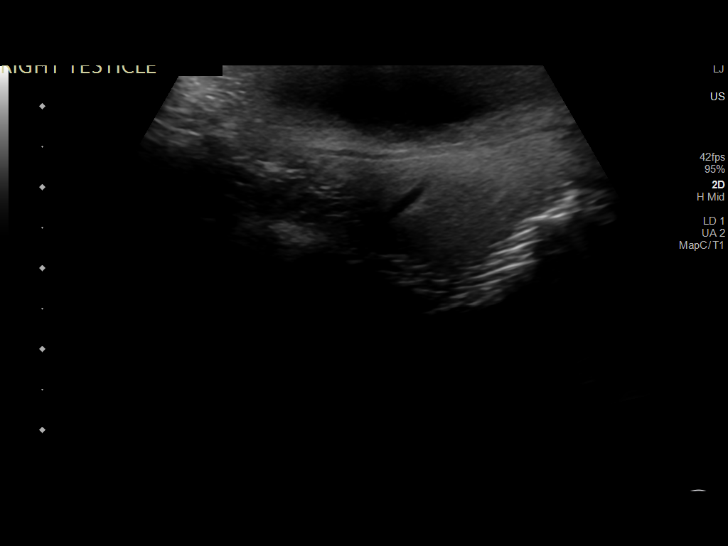
[im 28/83]
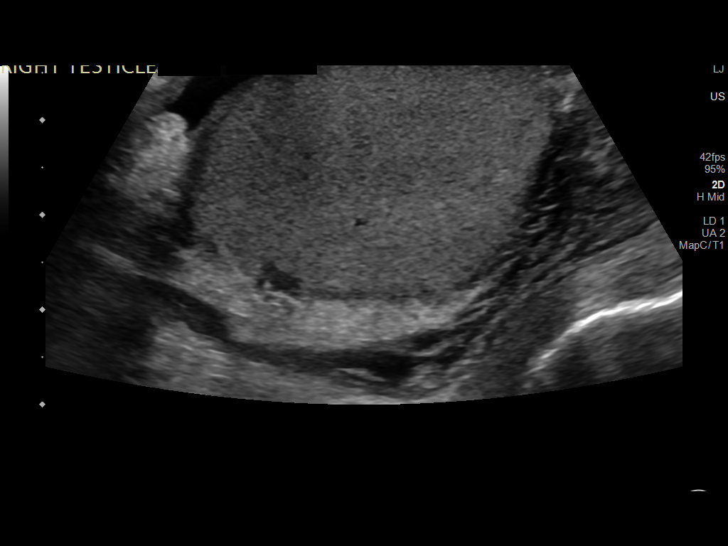
[im 35/83]
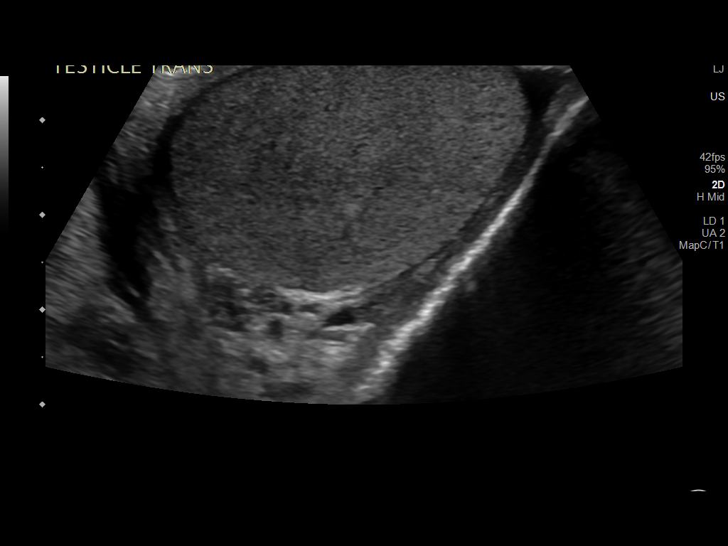
[im 42/83]
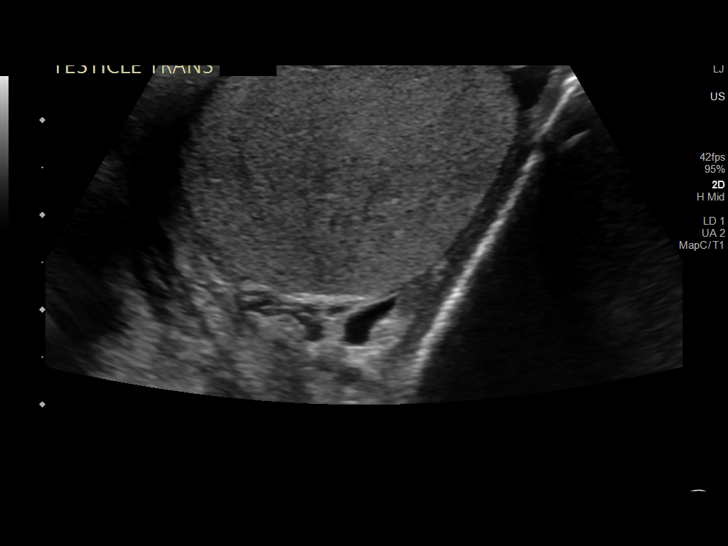
[im 48/83]
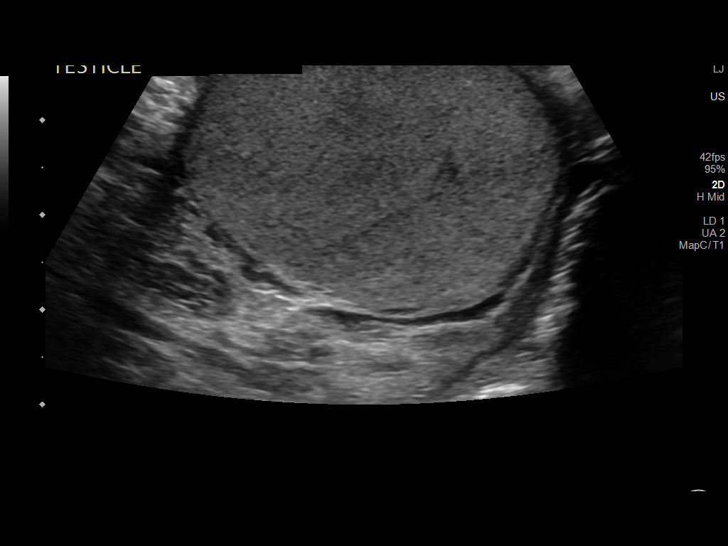
[im 55/83]
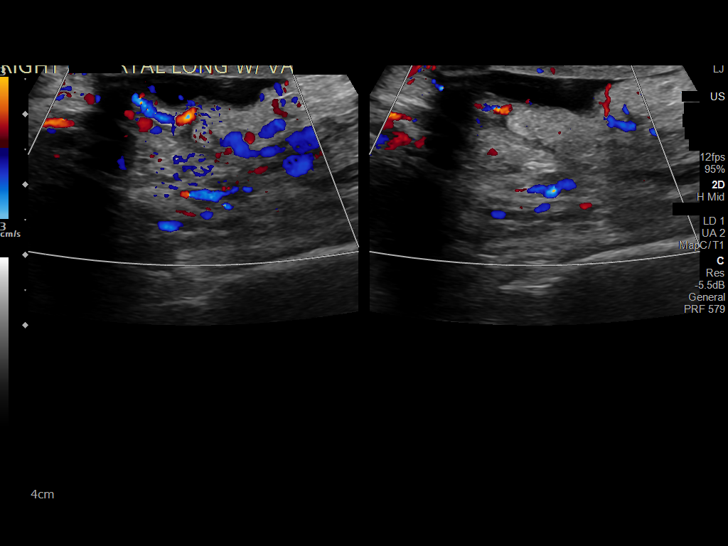
[im 62/83]
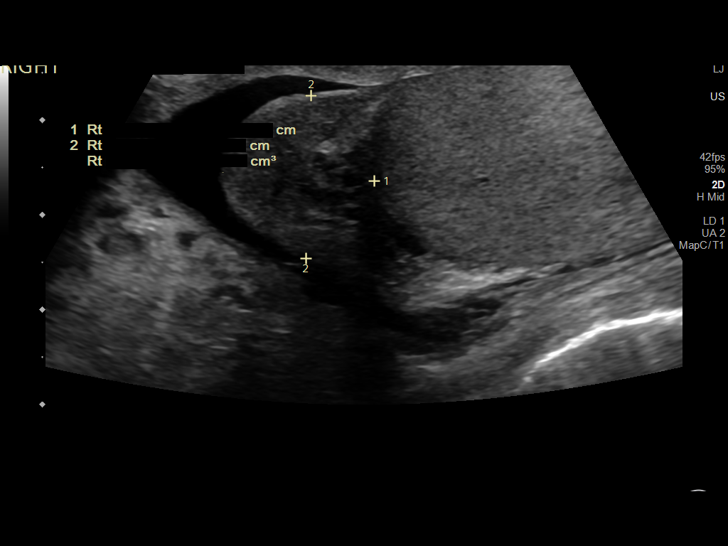
[im 69/83]
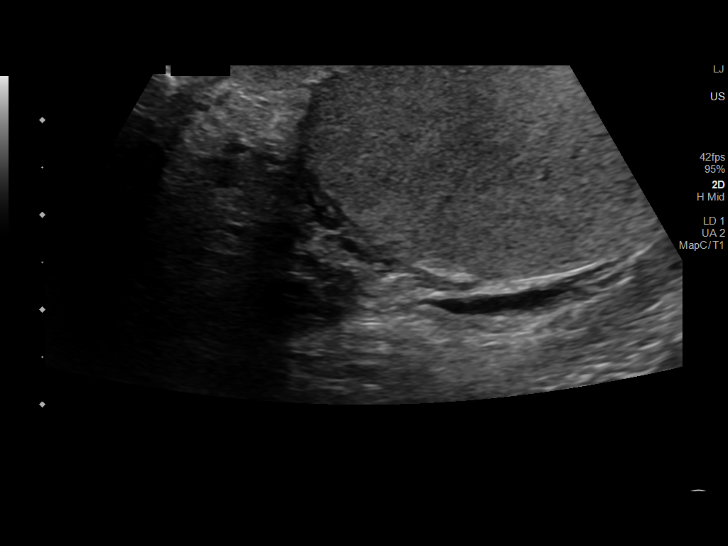
[im 76/83]
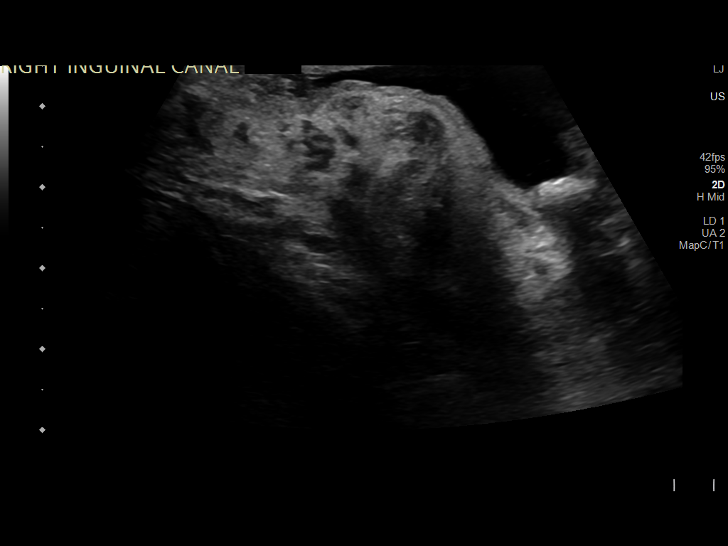
[im 83/83]
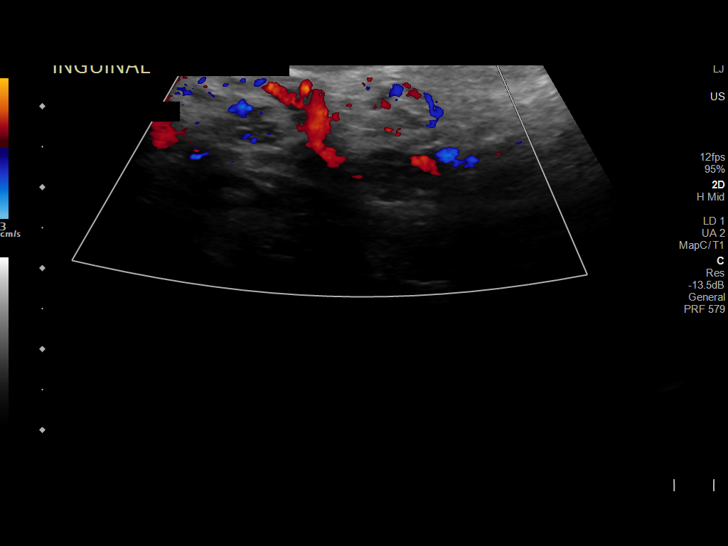

[13 of 25 positions shown; findings below may reference images not displayed]

FINDINGS: Right testicle

Measurements: 4.5 x 2.9 x 3.6 cm. Normal parenchymal echogenicity.
Internal blood flow present on color Doppler imaging. No definite
testicular mass, see below.

Left testicle

Measurements: 4.3 x 2.4 x 3.7 cm. Normal echogenicity without mass
or calcification. Internal blood flow present on color Doppler
imaging.

Right epididymis: Heterogeneous. Appears enlarged. Hypervascular on
color Doppler imaging. Findings suggest epididymitis.

Left epididymis: Slightly enlarged and heterogeneous with minimal
hypervascularity cannot exclude epididymitis.

Hydrocele: BILATERAL hydroceles, large RIGHT and small LEFT. 6 mm
scrotal pearl anterior to the RIGHT testis. An additional
calcification is seen posteriorly at the inferomedial RIGHT
hemiscrotum, 11 x 7 mm, likely a second scrotal pearl.

Varicocele:  None visualized

Pulsed Doppler interrogation of both testes demonstrates normal low
resistance arterial and venous waveforms bilaterally.

An area of prominent soft tissue is seen on several late images at
the inferior aspect of the RIGHT inguinal canal, cannot exclude a
fat containing hernia. This tissue appears to be somewhat
hypervascular.
IMPRESSION: Enlarged and heterogeneous epididymi bilaterally, greater on RIGHT,
question epididymitis.

No definite testicular abnormalities.

Probably 2 RIGHT scrotal pearls.

Cannot exclude small fat containing RIGHT inguinal hernia; recommend
correlation with physical exam.

## 2023-09-04 IMAGING — CR DG CHEST 2V
1 series · 2 of 2 positions shown · non-contrast
Comparison: None.

CLINICAL DATA: Cough and congestion.

EXAM:
CHEST - 2 VIEW

[Series 1: dg chest 2 view · 0.14mm/px · 2 of 2 slices shown]
[im 1/2]
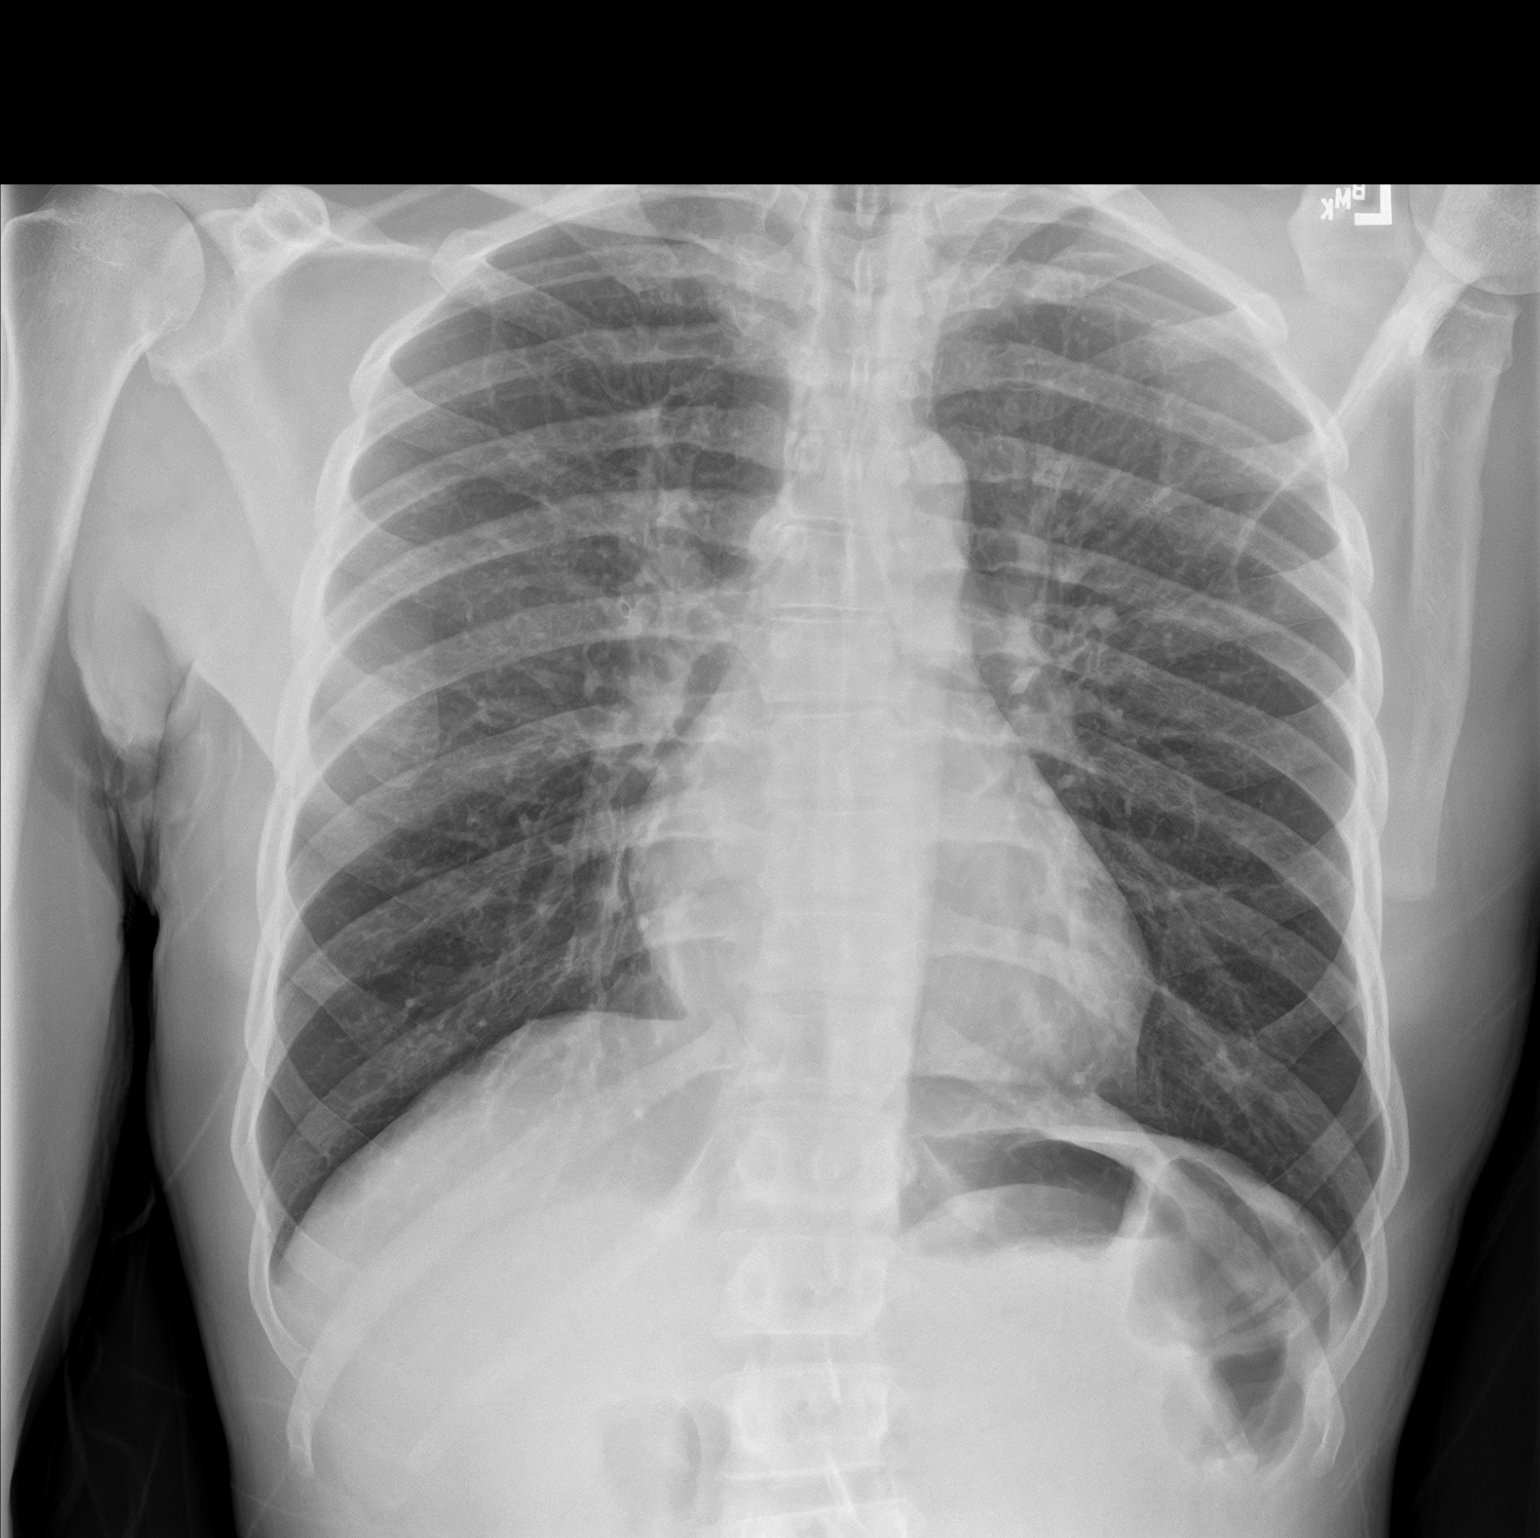
[im 2/2]
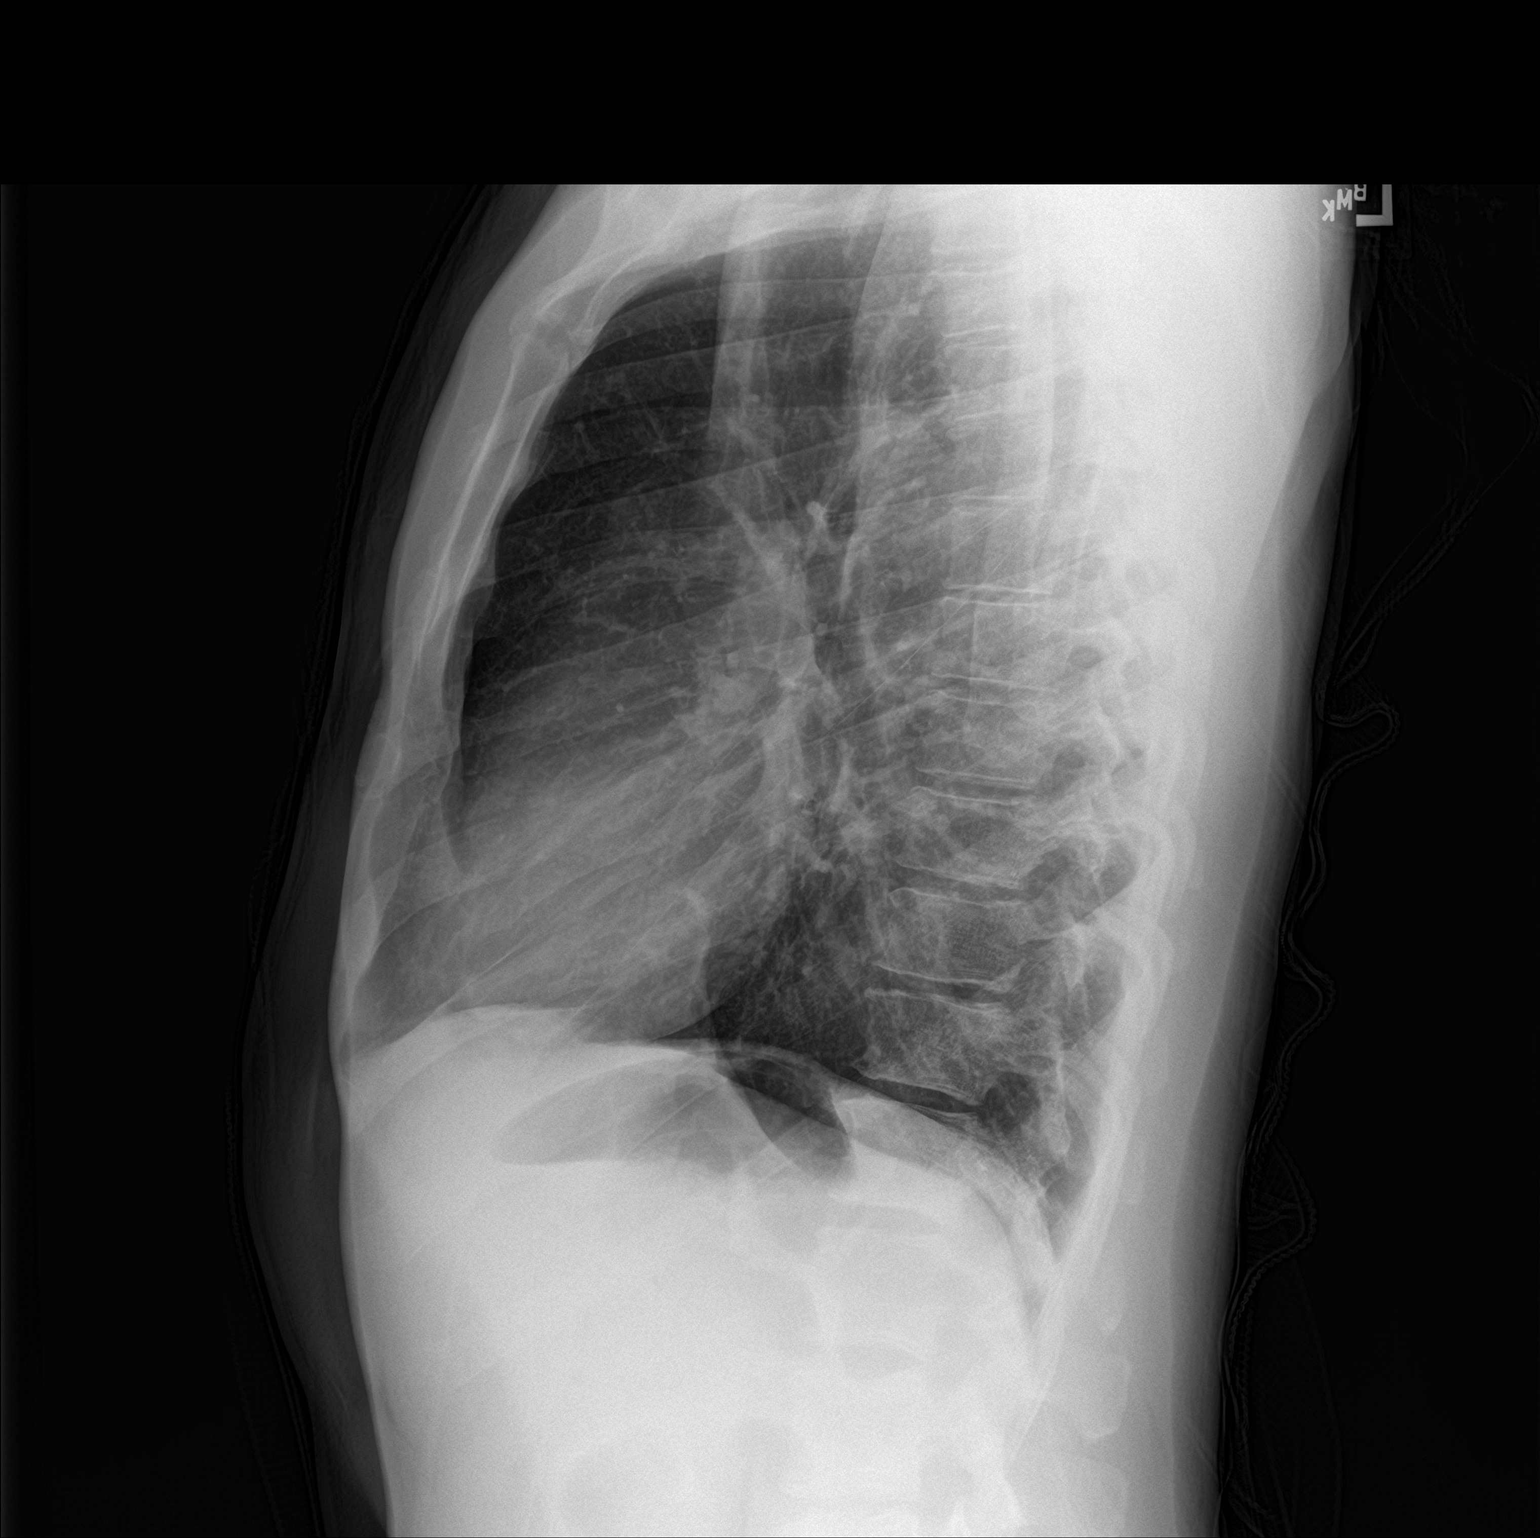

[2 of 2 positions shown; findings below may reference images not displayed]

FINDINGS: The heart size and mediastinal contours are within normal limits.
Both lungs are clear. The visualized skeletal structures are
unremarkable.
IMPRESSION: No active cardiopulmonary disease.

## 2024-11-25 ENCOUNTER — Ambulatory Visit: Payer: Self-pay | Admitting: General Surgery

## 2025-02-15 ENCOUNTER — Ambulatory Visit (HOSPITAL_COMMUNITY): Admit: 2025-02-15 | Payer: Self-pay | Admitting: General Surgery

## 2025-02-15 SURGERY — REPAIR, HERNIA, INGUINAL, ROBOT-ASSISTED, LAPAROSCOPIC, USING MESH
Anesthesia: General | Laterality: Right
